# Patient Record
Sex: Female | Born: 1960 | Race: Black or African American | Hispanic: No | Smoking: Former smoker
Health system: Southern US, Community
[De-identification: ages and names within clinical notes are randomized; demographics above are authoritative.]

## PROBLEM LIST (undated history)

## (undated) DIAGNOSIS — J45909 Unspecified asthma, uncomplicated: Secondary | ICD-10-CM

## (undated) DIAGNOSIS — I10 Essential (primary) hypertension: Secondary | ICD-10-CM

## (undated) DIAGNOSIS — N289 Disorder of kidney and ureter, unspecified: Secondary | ICD-10-CM

## (undated) DIAGNOSIS — J449 Chronic obstructive pulmonary disease, unspecified: Secondary | ICD-10-CM

## (undated) DIAGNOSIS — I639 Cerebral infarction, unspecified: Secondary | ICD-10-CM

## (undated) HISTORY — PX: A/V FISTULAGRAM: CATH118298

## (undated) HISTORY — PX: ABDOMINAL SURGERY: SHX537

## (undated) HISTORY — PX: ABDOMINAL HYSTERECTOMY: SHX81

## (undated) HISTORY — PX: HERNIA REPAIR: SHX51

---

## 2008-08-30 ENCOUNTER — Ambulatory Visit: Payer: Self-pay | Admitting: Diagnostic Radiology

## 2008-08-30 ENCOUNTER — Emergency Department (HOSPITAL_BASED_OUTPATIENT_CLINIC_OR_DEPARTMENT_OTHER): Admission: EM | Admit: 2008-08-30 | Discharge: 2008-08-30 | Payer: Self-pay | Admitting: Emergency Medicine

## 2010-12-03 ENCOUNTER — Emergency Department (HOSPITAL_BASED_OUTPATIENT_CLINIC_OR_DEPARTMENT_OTHER)
Admission: EM | Admit: 2010-12-03 | Discharge: 2010-12-03 | Disposition: A | Discharge status: Home / Self Care | Payer: PRIVATE HEALTH INSURANCE | Attending: Emergency Medicine | Admitting: Emergency Medicine

## 2010-12-03 DIAGNOSIS — Z79899 Other long term (current) drug therapy: Secondary | ICD-10-CM | POA: Insufficient documentation

## 2010-12-03 DIAGNOSIS — J449 Chronic obstructive pulmonary disease, unspecified: Secondary | ICD-10-CM | POA: Insufficient documentation

## 2010-12-03 DIAGNOSIS — E785 Hyperlipidemia, unspecified: Secondary | ICD-10-CM | POA: Insufficient documentation

## 2010-12-03 DIAGNOSIS — F329 Major depressive disorder, single episode, unspecified: Secondary | ICD-10-CM | POA: Insufficient documentation

## 2010-12-03 DIAGNOSIS — J4489 Other specified chronic obstructive pulmonary disease: Secondary | ICD-10-CM | POA: Insufficient documentation

## 2010-12-03 DIAGNOSIS — J329 Chronic sinusitis, unspecified: Secondary | ICD-10-CM | POA: Insufficient documentation

## 2010-12-03 DIAGNOSIS — I10 Essential (primary) hypertension: Secondary | ICD-10-CM | POA: Insufficient documentation

## 2010-12-03 DIAGNOSIS — F3289 Other specified depressive episodes: Secondary | ICD-10-CM | POA: Insufficient documentation

## 2014-10-04 ENCOUNTER — Other Ambulatory Visit: Payer: Self-pay | Admitting: Internal Medicine

## 2014-10-07 ENCOUNTER — Other Ambulatory Visit: Payer: Self-pay | Admitting: Internal Medicine

## 2014-11-03 ENCOUNTER — Other Ambulatory Visit: Payer: Self-pay | Admitting: Internal Medicine

## 2017-08-18 ENCOUNTER — Other Ambulatory Visit: Payer: Self-pay

## 2017-08-18 ENCOUNTER — Emergency Department (HOSPITAL_BASED_OUTPATIENT_CLINIC_OR_DEPARTMENT_OTHER): Payer: Medicare Other

## 2017-08-18 ENCOUNTER — Emergency Department (HOSPITAL_BASED_OUTPATIENT_CLINIC_OR_DEPARTMENT_OTHER)
Admission: EM | Admit: 2017-08-18 | Discharge: 2017-08-18 | Disposition: A | Discharge status: Home / Self Care | Payer: Medicare Other | Attending: Emergency Medicine | Admitting: Emergency Medicine

## 2017-08-18 DIAGNOSIS — N189 Chronic kidney disease, unspecified: Secondary | ICD-10-CM | POA: Insufficient documentation

## 2017-08-18 DIAGNOSIS — Z992 Dependence on renal dialysis: Secondary | ICD-10-CM | POA: Insufficient documentation

## 2017-08-18 DIAGNOSIS — D631 Anemia in chronic kidney disease: Secondary | ICD-10-CM | POA: Diagnosis not present

## 2017-08-18 DIAGNOSIS — J111 Influenza due to unidentified influenza virus with other respiratory manifestations: Secondary | ICD-10-CM | POA: Diagnosis not present

## 2017-08-18 DIAGNOSIS — R0602 Shortness of breath: Secondary | ICD-10-CM | POA: Diagnosis present

## 2017-08-18 DIAGNOSIS — N186 End stage renal disease: Secondary | ICD-10-CM

## 2017-08-18 DIAGNOSIS — R6889 Other general symptoms and signs: Secondary | ICD-10-CM

## 2017-08-18 LAB — CBC WITH DIFFERENTIAL/PLATELET
BASOS ABS: 0 10*3/uL (ref 0.0–0.1)
BASOS PCT: 0 %
EOS ABS: 0.1 10*3/uL (ref 0.0–0.7)
Eosinophils Relative: 1 %
HEMATOCRIT: 28.6 % — AB (ref 36.0–46.0)
HEMOGLOBIN: 8.8 g/dL — AB (ref 12.0–15.0)
Lymphocytes Relative: 12 %
Lymphs Abs: 1 10*3/uL (ref 0.7–4.0)
MCH: 30.6 pg (ref 26.0–34.0)
MCHC: 30.8 g/dL (ref 30.0–36.0)
MCV: 99.3 fL (ref 78.0–100.0)
Monocytes Absolute: 0.7 10*3/uL (ref 0.1–1.0)
Monocytes Relative: 9 %
NEUTROS ABS: 6.2 10*3/uL (ref 1.7–7.7)
NEUTROS PCT: 78 %
Platelets: 184 10*3/uL (ref 150–400)
RBC: 2.88 MIL/uL — AB (ref 3.87–5.11)
RDW: 14.9 % (ref 11.5–15.5)
WBC: 8 10*3/uL (ref 4.0–10.5)

## 2017-08-18 LAB — BASIC METABOLIC PANEL
ANION GAP: 13 (ref 5–15)
BUN: 24 mg/dL — ABNORMAL HIGH (ref 6–20)
CALCIUM: 8.4 mg/dL — AB (ref 8.9–10.3)
CO2: 23 mmol/L (ref 22–32)
CREATININE: 6.98 mg/dL — AB (ref 0.44–1.00)
Chloride: 102 mmol/L (ref 101–111)
GFR, EST AFRICAN AMERICAN: 7 mL/min — AB (ref >60)
GFR, EST NON AFRICAN AMERICAN: 6 mL/min — AB (ref >60)
Glucose, Bld: 93 mg/dL (ref 65–99)
Potassium: 5 mmol/L (ref 3.5–5.1)
SODIUM: 138 mmol/L (ref 135–145)

## 2017-08-18 LAB — TROPONIN I

## 2017-08-18 LAB — OCCULT BLOOD X 1 CARD TO LAB, STOOL: Fecal Occult Bld: NEGATIVE

## 2017-08-18 MED ORDER — OSELTAMIVIR PHOSPHATE 30 MG PO CAPS
30.0000 mg | ORAL_CAPSULE | Freq: Once | ORAL | Status: DC
  Filled 2017-08-18: qty 1

## 2017-08-18 MED ORDER — IOPAMIDOL (ISOVUE-370) INJECTION 76%
125.0000 mL | Freq: Once | INTRAVENOUS | Status: AC | PRN
  Administered 2017-08-18: 125 mL via INTRAVENOUS

## 2017-08-18 MED ORDER — OSELTAMIVIR PHOSPHATE 30 MG PO CAPS: 30.0000 mg | ORAL_CAPSULE | Freq: Every day | ORAL | 0 refills | Status: AC

## 2017-08-18 MED ORDER — IPRATROPIUM-ALBUTEROL 0.5-2.5 (3) MG/3ML IN SOLN
3.0000 mL | Freq: Once | RESPIRATORY_TRACT | Status: AC
  Administered 2017-08-18: 3 mL via RESPIRATORY_TRACT
  Filled 2017-08-18: qty 3

## 2017-08-18 NOTE — ED Notes (Signed)
Pt ambulated while on SpO2. Lowest SpO2 90%. Pt became very dyspneic and reports dizziness. HR was at 89.

## 2017-08-18 NOTE — ED Notes (Signed)
Patient transported to X-ray 

## 2017-08-18 NOTE — ED Triage Notes (Addendum)
Pt presents to triage with RR approx 40. RA sats 96%. BBS clear.Encouraged to try to slow breathing. Pt states she is on dialysis (Last dialysis was yesterday). RT in triage to assess. Pt reports increased cough. States she uses home oxygen as needed. 2L nasal cannula applied. Pt states "that really helps". Pt is wearing abdominal binder from recent surgery in the last 2 weeks

## 2017-08-18 NOTE — ED Notes (Signed)
ED Provider at bedside. 

## 2017-08-18 NOTE — Discharge Instructions (Signed)
Take tamiflu as prescribed for the next 5 days.  Continue using your home oxygen as needed.  Follow up with your doctor for further care.  Your chest CT show a nodule in your left lower lung.  Please have a repeat chest CT scan in 6-12 month to monitor for changes of the nodule.

## 2017-08-18 NOTE — ED Provider Notes (Signed)
[facility] EMERGENCY DEPARTMENT Provider Note   CSN: [id number] Arrival date & time: 08/18/17  1821     History   Chief Complaint Chief Complaint  Patient presents with  . Shortness of Breath    HPI Edward JollyLinda Arai is a 57 y.o. female.  HPI   57 year old female presenting to the ER for evaluation of shortness of breath.  Patient is a Monday Wednesday Friday dialysis patient who had her dialysis yesterday.  She noticed after dialysis she developed sinus congestion, increased shortness of breath with exertion, coughing, and runny nose.  Today she is running a low-grade fever.  She used her inhaler at home without adequate relief.  She did not notice any increased wheezing.  She admits to having recent abdominal surgery.  Initially had an abdominal hernia repair on January 8.  A week later she has to have her peritoneal dialysis catheter exchange under general anesthesia.  Patient denies any active chest pain.  She does not make urine.  No dysuria.  No nausea vomiting diarrhea.  She did receive her flu shot this year.  No past medical history on file.  There are no active problems to display for this patient.   The histories are not reviewed yet. Please review them in the "History" navigator section and refresh this SmartLink.  OB History    No data available       Home Medications    Prior to Admission medications   Not on File    Family History No family history on file.  Social History Social History   Tobacco Use  . Smoking status: Not on file  Substance Use Topics  . Alcohol use: Not on file  . Drug use: Not on file     Allergies   Patient has no known allergies.   Review of Systems Review of Systems  All other systems reviewed and are negative.    Physical Exam Updated Vital Signs BP (!) 137/103   Pulse 80   Temp (!) 100.5 F (38.1 C) (Oral)   Resp (!) 36   Ht 5\' 6"  (1.676 m)   Wt 77.1 kg (170 lb)   SpO2 96%   BMI 27.44 kg/m    Physical Exam  Constitutional: She appears well-developed and well-nourished. No distress.  HENT:  Head: Atraumatic.  Mouth/Throat: Oropharynx is clear and moist. No oropharyngeal exudate or posterior oropharyngeal edema.  Eyes: Conjunctivae and EOM are normal. Pupils are equal, round, and reactive to light.  Neck: Neck supple.  Cardiovascular: Normal rate and regular rhythm.  Pulmonary/Chest: Effort normal and breath sounds normal. No accessory muscle usage or stridor. No respiratory distress. She has no decreased breath sounds. She has no wheezes. She has no rales.  Abdominal: Soft. There is no tenderness.  Musculoskeletal:       Right lower leg: She exhibits no edema.       Left lower leg: She exhibits no edema.  Neurological: She is alert.  Skin: No rash noted.  Psychiatric: She has a normal mood and affect.  Nursing note and vitals reviewed.    ED Treatments / Results  Labs (all labs ordered are listed, but only abnormal results are displayed) Labs Reviewed  BASIC METABOLIC PANEL - Abnormal; Notable for the following components:      Result Value   BUN 24 (*)    Creatinine, Ser 6.98 (*)    Calcium 8.4 (*)    GFR calc non Af Amer 6 (*)  GFR calc Af Amer 7 (*)    All other components within normal limits  CBC WITH DIFFERENTIAL/PLATELET - Abnormal; Notable for the following components:   RBC 2.88 (*)    Hemoglobin 8.8 (*)    HCT 28.6 (*)    All other components within normal limits  TROPONIN I  OCCULT BLOOD X 1 CARD TO LAB, STOOL  INFLUENZA PANEL BY PCR (TYPE A & B)    EKG  EKG Interpretation  Date/Time:  Saturday August 18 2017 18:33:32 EST Ventricular Rate:  83 PR Interval:  140 QRS Duration: 84 QT Interval:  432 QTC Calculation: 507 R Axis:   6 Text Interpretation:  Normal sinus rhythm Anterior infarct , age undetermined Abnormal ECG No old tracing to compare Confirmed by Rolan Bucco [phone]) on 08/18/2017 6:47:30 PM       Radiology Dg Chest 2  View  Result Date: 08/18/2017 CLINICAL DATA:  Shortness of breath. EXAM: CHEST  2 VIEW COMPARISON:  Radiograph of August 05, 2017. FINDINGS: Stable cardiomegaly with mild central pulmonary vascular congestion. No pneumothorax or pleural effusion is noted. Both lungs are clear. The visualized skeletal structures are unremarkable. IMPRESSION: Stable cardiomegaly with mild central pulmonary vascular congestion. Electronically Signed   By: Lupita Raider, M.D.   On: 08/18/2017 19:53   Ct Angio Chest Pe W And/or Wo Contrast  Result Date: 08/18/2017 CLINICAL DATA:  Shortness of breath. EXAM: CT ANGIOGRAPHY CHEST WITH CONTRAST TECHNIQUE: Multidetector CT imaging of the chest was performed using the standard protocol during bolus administration of intravenous contrast. Multiplanar CT image reconstructions and MIPs were obtained to evaluate the vascular anatomy. CONTRAST:  80 mL ISOVUE-370 IOPAMIDOL (ISOVUE-370) INJECTION 76% COMPARISON:  Radiographs of same day.  CT scan of July 03, 2014. FINDINGS: Cardiovascular: Satisfactory opacification of the pulmonary arteries to the segmental level. No evidence of pulmonary embolism. Normal heart size. No pericardial effusion. Atherosclerosis of thoracic aorta is noted without aneurysm formation. Mediastinum/Nodes: Thyroid gland and esophagus are unremarkable. Stable adenopathy is noted in aortopulmonary window which most likely is reactive and benign in etiology. Lungs/Pleura: No pneumothorax or pleural effusion is noted. 7 mm nodule is noted in lingular segment of left upper lobe best seen on image number 55 of series 5. Right lung is unremarkable. Upper Abdomen: Bilateral renal atrophy is noted consistent with history of end-stage renal disease. Musculoskeletal: No chest wall abnormality. No acute or significant osseous findings. Review of the MIP images confirms the above findings. IMPRESSION: No definite evidence of pulmonary embolus. Stable mediastinal adenopathy is  noted which most likely is reactive and benign in etiology. 7 mm nodule noted in lingular segment of left upper lobe. Non-contrast chest CT at 6-12 months is recommended. If the nodule is stable at time of repeat CT, then future CT at 18-24 months (from today's scan) is considered optional for low-risk patients, but is recommended for high-risk patients. This recommendation follows the consensus statement: Guidelines for Management of Incidental Pulmonary Nodules Detected on CT Images: From the Fleischner Society 2017; Radiology 2017; 284:228-243. Aortic Atherosclerosis (ICD10-I70.0). Electronically Signed   By: Lupita Raider, M.D.   On: 08/18/2017 20:19    Procedures Procedures (including critical care time)  Medications Ordered in ED Medications  ipratropium-albuterol (DUONEB) 0.5-2.5 (3) MG/3ML nebulizer solution 3 mL (3 mLs Nebulization Given 08/18/17 2000)  iopamidol (ISOVUE-370) 76 % injection 125 mL (125 mLs Intravenous Contrast Given 08/18/17 1942)     Initial Impression / Assessment and Plan / ED Course  I have reviewed the triage vital signs and the nursing notes.  Pertinent labs & imaging results that were available during my care of the patient were reviewed by me and considered in my medical decision making (see chart for details).     BP (!) 161/119   Pulse 83   Temp (!) 100.5 F (38.1 C) (Oral)   Resp (!) 26   Ht 5\' 6"  (1.676 m)   Wt 77.1 kg (170 lb)   SpO2 95%   BMI 27.44 kg/m    Final Clinical Impressions(s) / ED Diagnoses   Final diagnoses:  Flu-like symptoms  Anemia due to chronic kidney disease, on chronic dialysis [facility])    ED Discharge Orders        Ordered    oseltamivir (TAMIFLU) 30 MG capsule  Daily     08/18/17 2302     10:58 PM Patient came in with fever, congestion, shortness of breath and cough.  She is a dialysis patient.  She mentioned having recent abdominal surgery several weeks prior which increased potential risk of PE.  She also has  flulike symptoms.  Influenza test sent.  She recently had her dialysis yesterday.  Fortunately her potassium level is within normal limits.  Her hemoglobin is 8.8, no prior comparison.  Fecal occult test is negative.  A CT of the chest angiogram without evidence of PE.  Incidental nodule noted to left lower lung.  Patient made aware and I encourage repeat CT scan in 6-12 months.  Patient voiced understanding.  She does use home oxygen.  When ambulate, O2 sats never dropped below 90.  Plan to discharge patient with Tamiflu.  Patient will follow with her primary care provider next week.  Return precautions discussed.  Low suspicion for ACS.    Fayrene Helper, PA-C 08/18/17 1610    Rolan Bucco, MD 08/18/17 [phone]

## 2017-08-19 LAB — INFLUENZA PANEL BY PCR (TYPE A & B)
INFLBPCR: NEGATIVE
Influenza A By PCR: NEGATIVE

## 2017-12-02 ENCOUNTER — Encounter (HOSPITAL_BASED_OUTPATIENT_CLINIC_OR_DEPARTMENT_OTHER): Payer: Self-pay | Admitting: Emergency Medicine

## 2017-12-02 ENCOUNTER — Other Ambulatory Visit: Payer: Self-pay

## 2017-12-02 ENCOUNTER — Emergency Department (HOSPITAL_BASED_OUTPATIENT_CLINIC_OR_DEPARTMENT_OTHER)
Admission: EM | Admit: 2017-12-02 | Discharge: 2017-12-02 | Disposition: A | Discharge status: Home / Self Care | Payer: Medicare Other | Attending: Emergency Medicine | Admitting: Emergency Medicine

## 2017-12-02 DIAGNOSIS — H9201 Otalgia, right ear: Secondary | ICD-10-CM | POA: Diagnosis not present

## 2017-12-02 DIAGNOSIS — Z992 Dependence on renal dialysis: Secondary | ICD-10-CM | POA: Diagnosis not present

## 2017-12-02 DIAGNOSIS — J449 Chronic obstructive pulmonary disease, unspecified: Secondary | ICD-10-CM | POA: Diagnosis not present

## 2017-12-02 DIAGNOSIS — N186 End stage renal disease: Secondary | ICD-10-CM | POA: Diagnosis not present

## 2017-12-02 DIAGNOSIS — Z87891 Personal history of nicotine dependence: Secondary | ICD-10-CM | POA: Diagnosis not present

## 2017-12-02 DIAGNOSIS — J029 Acute pharyngitis, unspecified: Secondary | ICD-10-CM | POA: Diagnosis not present

## 2017-12-02 DIAGNOSIS — I120 Hypertensive chronic kidney disease with stage 5 chronic kidney disease or end stage renal disease: Secondary | ICD-10-CM | POA: Diagnosis not present

## 2017-12-02 DIAGNOSIS — Z8673 Personal history of transient ischemic attack (TIA), and cerebral infarction without residual deficits: Secondary | ICD-10-CM | POA: Insufficient documentation

## 2017-12-02 HISTORY — DX: Disorder of kidney and ureter, unspecified: N28.9

## 2017-12-02 HISTORY — DX: Cerebral infarction, unspecified: I63.9

## 2017-12-02 HISTORY — DX: Chronic obstructive pulmonary disease, unspecified: J44.9

## 2017-12-02 HISTORY — DX: Unspecified asthma, uncomplicated: J45.909

## 2017-12-02 HISTORY — DX: Essential (primary) hypertension: I10

## 2017-12-02 LAB — RAPID STREP SCREEN (MED CTR MEBANE ONLY): Streptococcus, Group A Screen (Direct): NEGATIVE

## 2017-12-02 NOTE — ED Triage Notes (Signed)
Patient states that she has had N/V/D up until 3 days ago  - she reports that she is now eating and drinking normally. The patient states that she is here today because she has right ear pain and dizziness  - the patient is a dialysis patient

## 2017-12-02 NOTE — Discharge Instructions (Addendum)
Take over-the-counter medications as needed for pain, follow-up with your doctor next week if not improving

## 2017-12-02 NOTE — ED Provider Notes (Signed)
[facility] EMERGENCY DEPARTMENT Provider Note   CSN: [id number] Arrival date & time: 12/02/17  1051     History   Chief Complaint Chief Complaint  Patient presents with  . Otalgia  . Sore Throat    HPI Rachel Parrish is a 57 y.o. female.  HPI Patient presents to the emergency room for evaluation of earache and sore throat.  Patient states she noticed the symptoms yesterday.  She is having pain in her right ear as well as pain in the right side of her throat.  It hurts when she swallows.  She has had some nasal congestion and drainage.  She denies any fevers or chills.  No shortness of breath.  No chest pain.  She denies any numbness or weakness or trouble with her balance or.  Patient mentioned having some trouble with nausea vomiting and diarrhea but that resolved 3 days ago when she is been asymptomatic since.  Patient is a dialysis patient and has been going regularly.  She denies any issues to see with that. Past Medical History:  Diagnosis Date  . Asthma   . COPD (chronic obstructive pulmonary disease) (HCC)   . Hypertension   . Renal disorder    chronic kidney disease   . Stroke [facility])    TIA    There are no active problems to display for this patient.   Past Surgical History:  Procedure Laterality Date  . A/V FISTULAGRAM    . ABDOMINAL HYSTERECTOMY    . ABDOMINAL SURGERY    . HERNIA REPAIR       OB History   None      Home Medications    Prior to Admission medications   Not on File    Family History History reviewed. No pertinent family history.  Social History Social History   Tobacco Use  . Smoking status: Former Games developer  . Smokeless tobacco: Never Used  Substance Use Topics  . Alcohol use: Never    Frequency: Never  . Drug use: Never     Allergies   Patient has no known allergies.   Review of Systems Review of Systems  All other systems reviewed and are negative.    Physical Exam Updated Vital Signs BP 105/70  (BP Location: Right Arm)   Pulse 82   Temp 98.2 F (36.8 C) (Oral)   Resp 18   Ht 1.676 m ( )   Wt 68 kg (150 lb)   SpO2 100%   BMI 24.21 kg/m   Physical Exam  Constitutional: She appears well-developed and well-nourished. No distress.  HENT:  Head: Normocephalic and atraumatic.  Right Ear: Tympanic membrane, external ear and ear canal normal. No drainage or swelling.  Left Ear: Tympanic membrane, external ear and ear canal normal. No drainage or swelling.  Mouth/Throat: Uvula is midline, oropharynx is clear and moist and mucous membranes are normal. No tonsillar exudate.  Eyes: Conjunctivae are normal. Right eye exhibits no discharge. Left eye exhibits no discharge. No scleral icterus.  Neck: Neck supple. No tracheal deviation present.  Cardiovascular: Normal rate, regular rhythm and intact distal pulses.  Pulmonary/Chest: Effort normal and breath sounds normal. No stridor. No respiratory distress. She has no wheezes. She has no rales.  Abdominal: Soft. Bowel sounds are normal. She exhibits no distension. There is no tenderness. There is no rebound and no guarding.  Musculoskeletal: She exhibits no edema or tenderness.  Lymphadenopathy:    She has no cervical adenopathy.  Neurological: She is alert.  She has normal strength. No cranial nerve deficit (no facial droop, extraocular movements intact, no slurred speech) or sensory deficit. She exhibits normal muscle tone. She displays no seizure activity. Coordination normal.  Skin: Skin is warm and dry. No rash noted.  Psychiatric: She has a normal mood and affect.  Nursing note and vitals reviewed.    ED Treatments / Results  Labs (all labs ordered are listed, but only abnormal results are displayed) Labs Reviewed  RAPID STREP SCREEN ([facility] ONLY)  CULTURE, GROUP A STREP [facility])     Procedures Procedures (including critical care time)  Medications Ordered in ED Medications - No data to display   Initial Impression  / Assessment and Plan / ED Course  I have reviewed the triage vital signs and the nursing notes.  Pertinent labs & imaging results that were available during my care of the patient were reviewed by me and considered in my medical decision making (see chart for details).   Patient presents with complaints of sore throat and earache.  No signs of abscess or abnormality noted on exam.  Strep test is negative.  Most likely viral pharyngitis.  Discussed outpatient management with over-the-counter medications.  Follow-up with her doctor if not improving in a week   Final Clinical Impressions(s) / ED Diagnoses   Final diagnoses:  Pharyngitis, unspecified etiology    ED Discharge Orders    None       Linwood Dibbles, MD 12/02/17 1235

## 2017-12-04 LAB — CULTURE, GROUP A STREP (THRC)

## 2019-04-21 IMAGING — CT CT ANGIO CHEST
2 of 8 series · 18 of 36 positions shown · IV contrast (iopamidol)
Comparison: Radiographs of same day.  CT scan July 03, 2014.

CLINICAL DATA: Shortness of breath.

EXAM:
CT ANGIOGRAPHY CHEST WITH CONTRAST
TECHNIQUE: Multidetector CT imaging of the chest was performed using the
standard protocol during bolus administration of intravenous
contrast. Multiplanar CT image reconstructions and MIPs were
obtained to evaluate the vascular anatomy.
CONTRAST:  80 mL UK7H2C-E00 IOPAMIDOL (UK7H2C-E00) INJECTION 76%

[Series 6: pe thins · axial · 0.71mm/px · z∈[-352,-50]mm · 17 of 338 slices shown]
[im 18/338  lung]
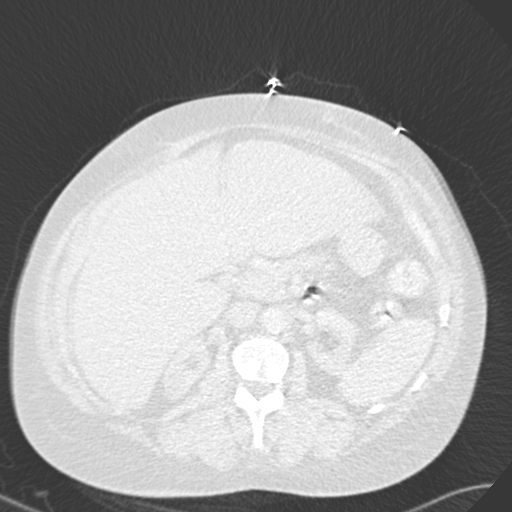
[im 36/338  mediastinal]
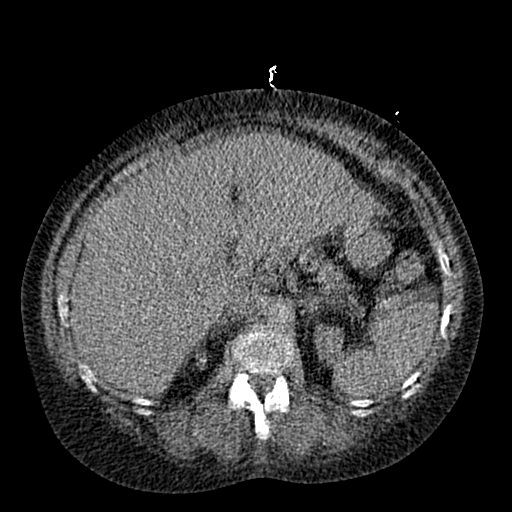
[im 54/338  lung]
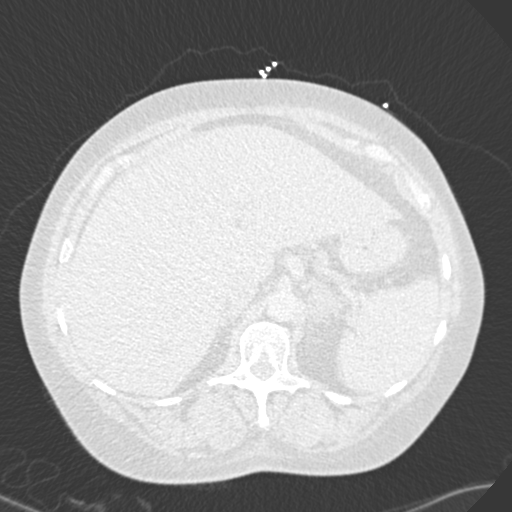
[im 71/338  mediastinal]
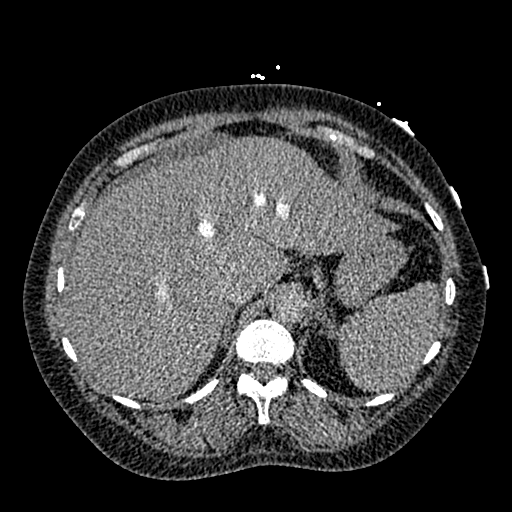
[im 89/338  lung]
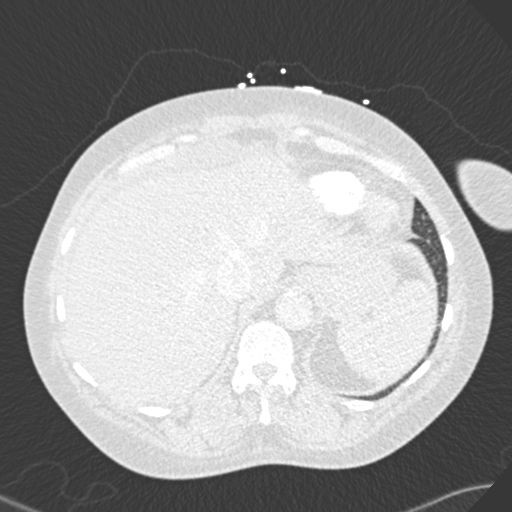
[im 107/338  mediastinal]
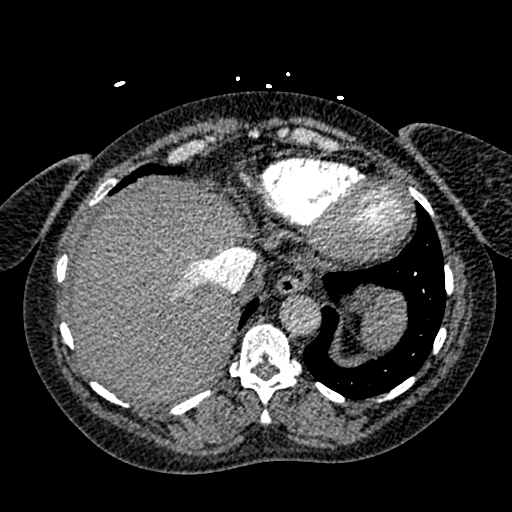
[im 125/338  lung]
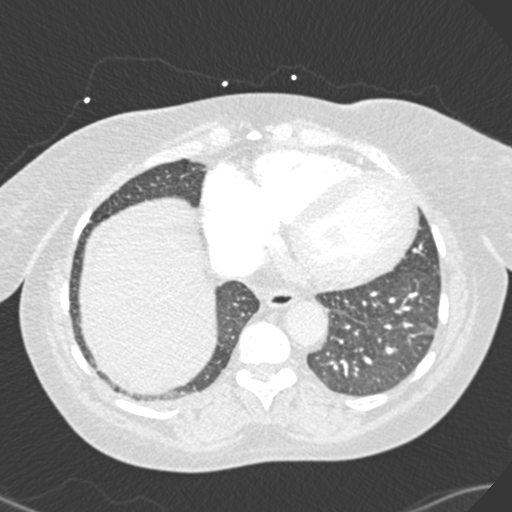
[im 142/338  mediastinal]
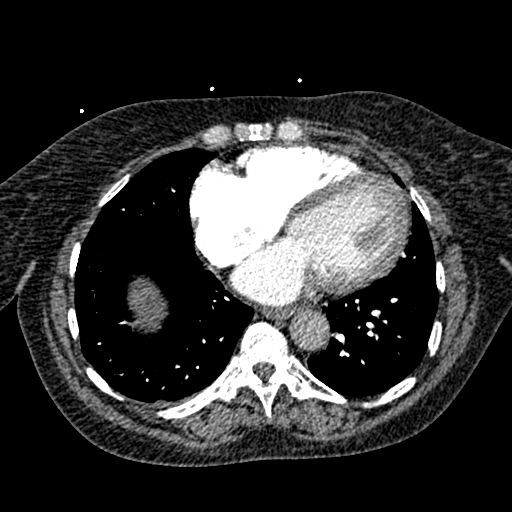
[im 178/338  lung]
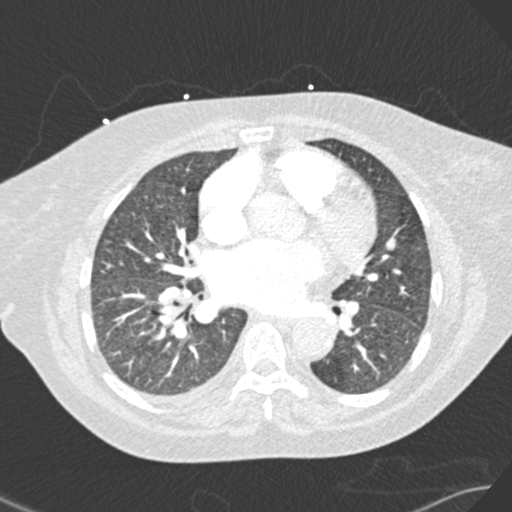
[im 196/338  mediastinal]
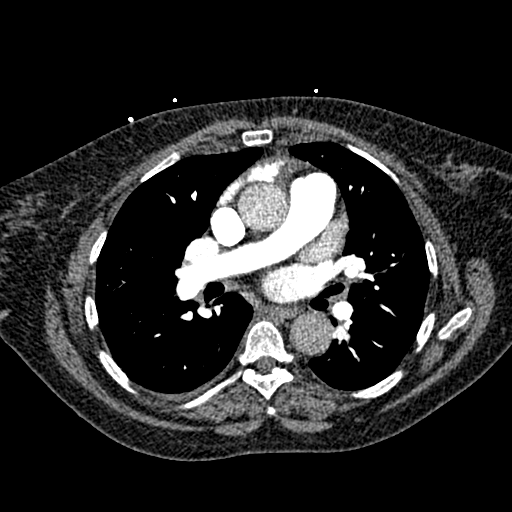
[im 213/338  lung]
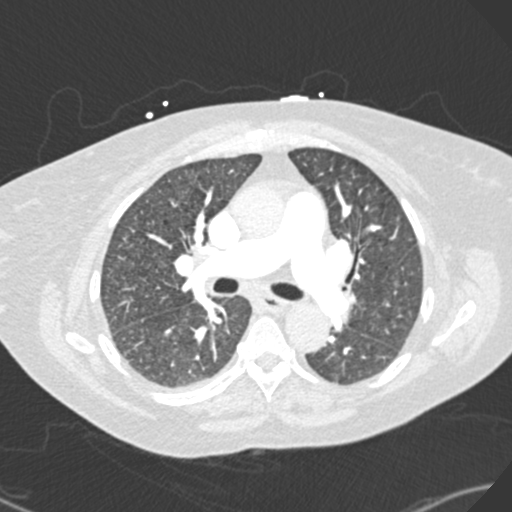
[im 231/338  mediastinal]
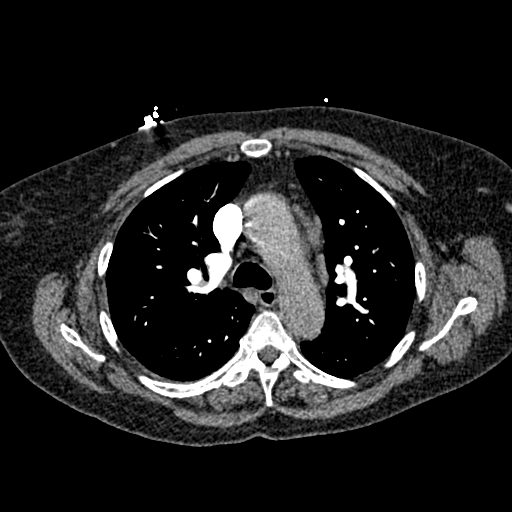
[im 249/338  lung]
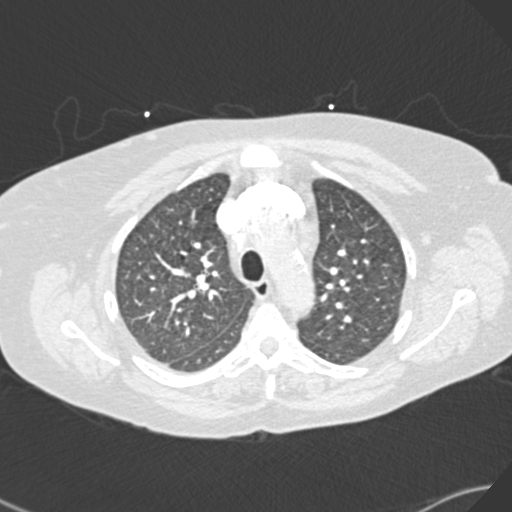
[im 267/338  mediastinal]
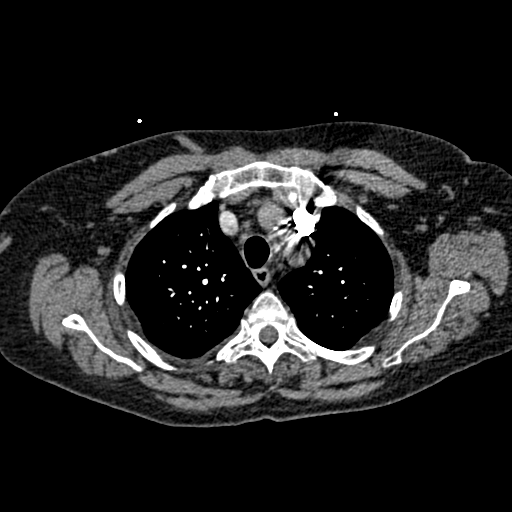
[im 284/338  lung]
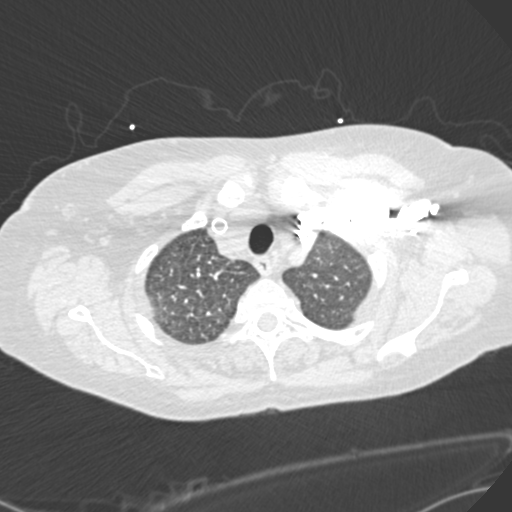
[im 302/338  mediastinal]
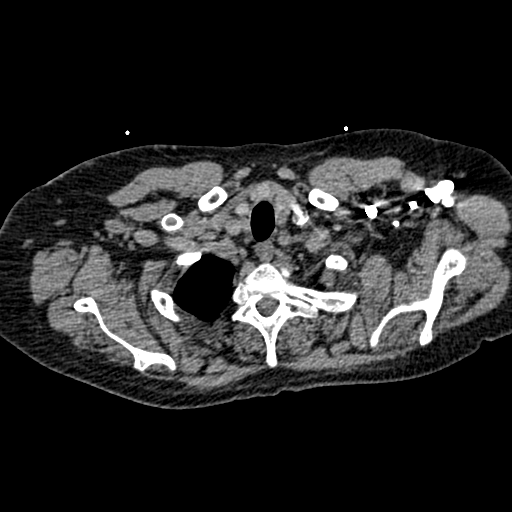
[im 320/338  lung]
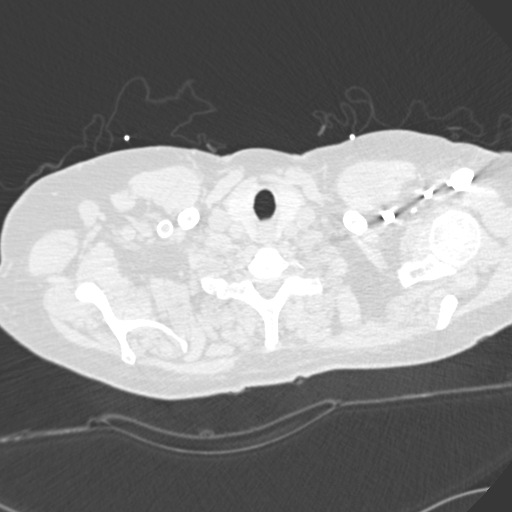

[Series 7: pe coronal mpr · coronal · 0.66mm/px · 1 of 143 slices shown]
[im 72/143  mediastinal]
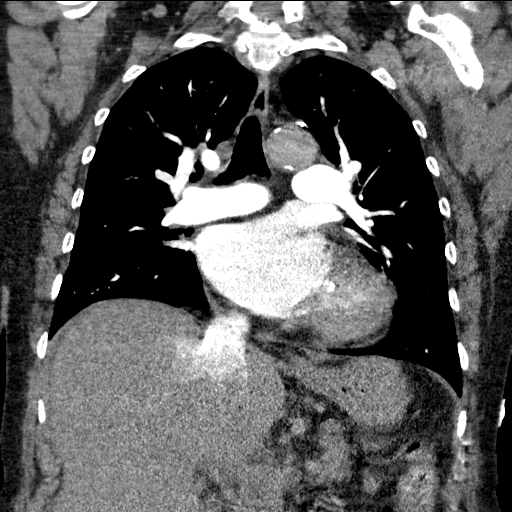

[18 of 36 positions shown; findings below may reference images not displayed]

FINDINGS: Cardiovascular: Satisfactory opacification of the pulmonary arteries
to the segmental level. No evidence of pulmonary embolism. Normal
heart size. No pericardial effusion. Atherosclerosis of thoracic
aorta is noted without aneurysm formation.

Mediastinum/Nodes: Thyroid gland and esophagus are unremarkable.
Stable adenopathy is noted in aortopulmonary window which most
likely is reactive and benign in etiology.

Lungs/Pleura: No pneumothorax or pleural effusion is noted. 7 mm
nodule is noted in lingular segment of left upper lobe best seen on
image number 55 of series 5. Right lung is unremarkable.

Upper Abdomen: Bilateral renal atrophy is noted consistent with
history of end-stage renal disease.

Musculoskeletal: No chest wall abnormality. No acute or significant
osseous findings.

Review of the MIP images confirms the above findings.
IMPRESSION: No definite evidence of pulmonary embolus.

Stable mediastinal adenopathy is noted which most likely is reactive
and benign in etiology.

7 mm nodule noted in lingular segment of left upper lobe.
Non-contrast chest CT at 6-12 months is recommended. If the nodule
is stable at time of repeat CT, then future CT at 18-24 months (from
today's scan) is considered optional for low-risk patients, but is
recommended for high-risk patients. This recommendation follows the
consensus statement: Guidelines for Management of Incidental
Pulmonary Nodules Detected on CT Images: From the [HOSPITAL]

Aortic Atherosclerosis (LK0NF-YHX.X).

## 2019-04-21 IMAGING — DX DG CHEST 2V
2 series · 2 of 2 positions shown · non-contrast
Comparison: Radiograph August 05, 2017.

CLINICAL DATA: Shortness of breath.

EXAM:
CHEST  2 VIEW

[chest pa]
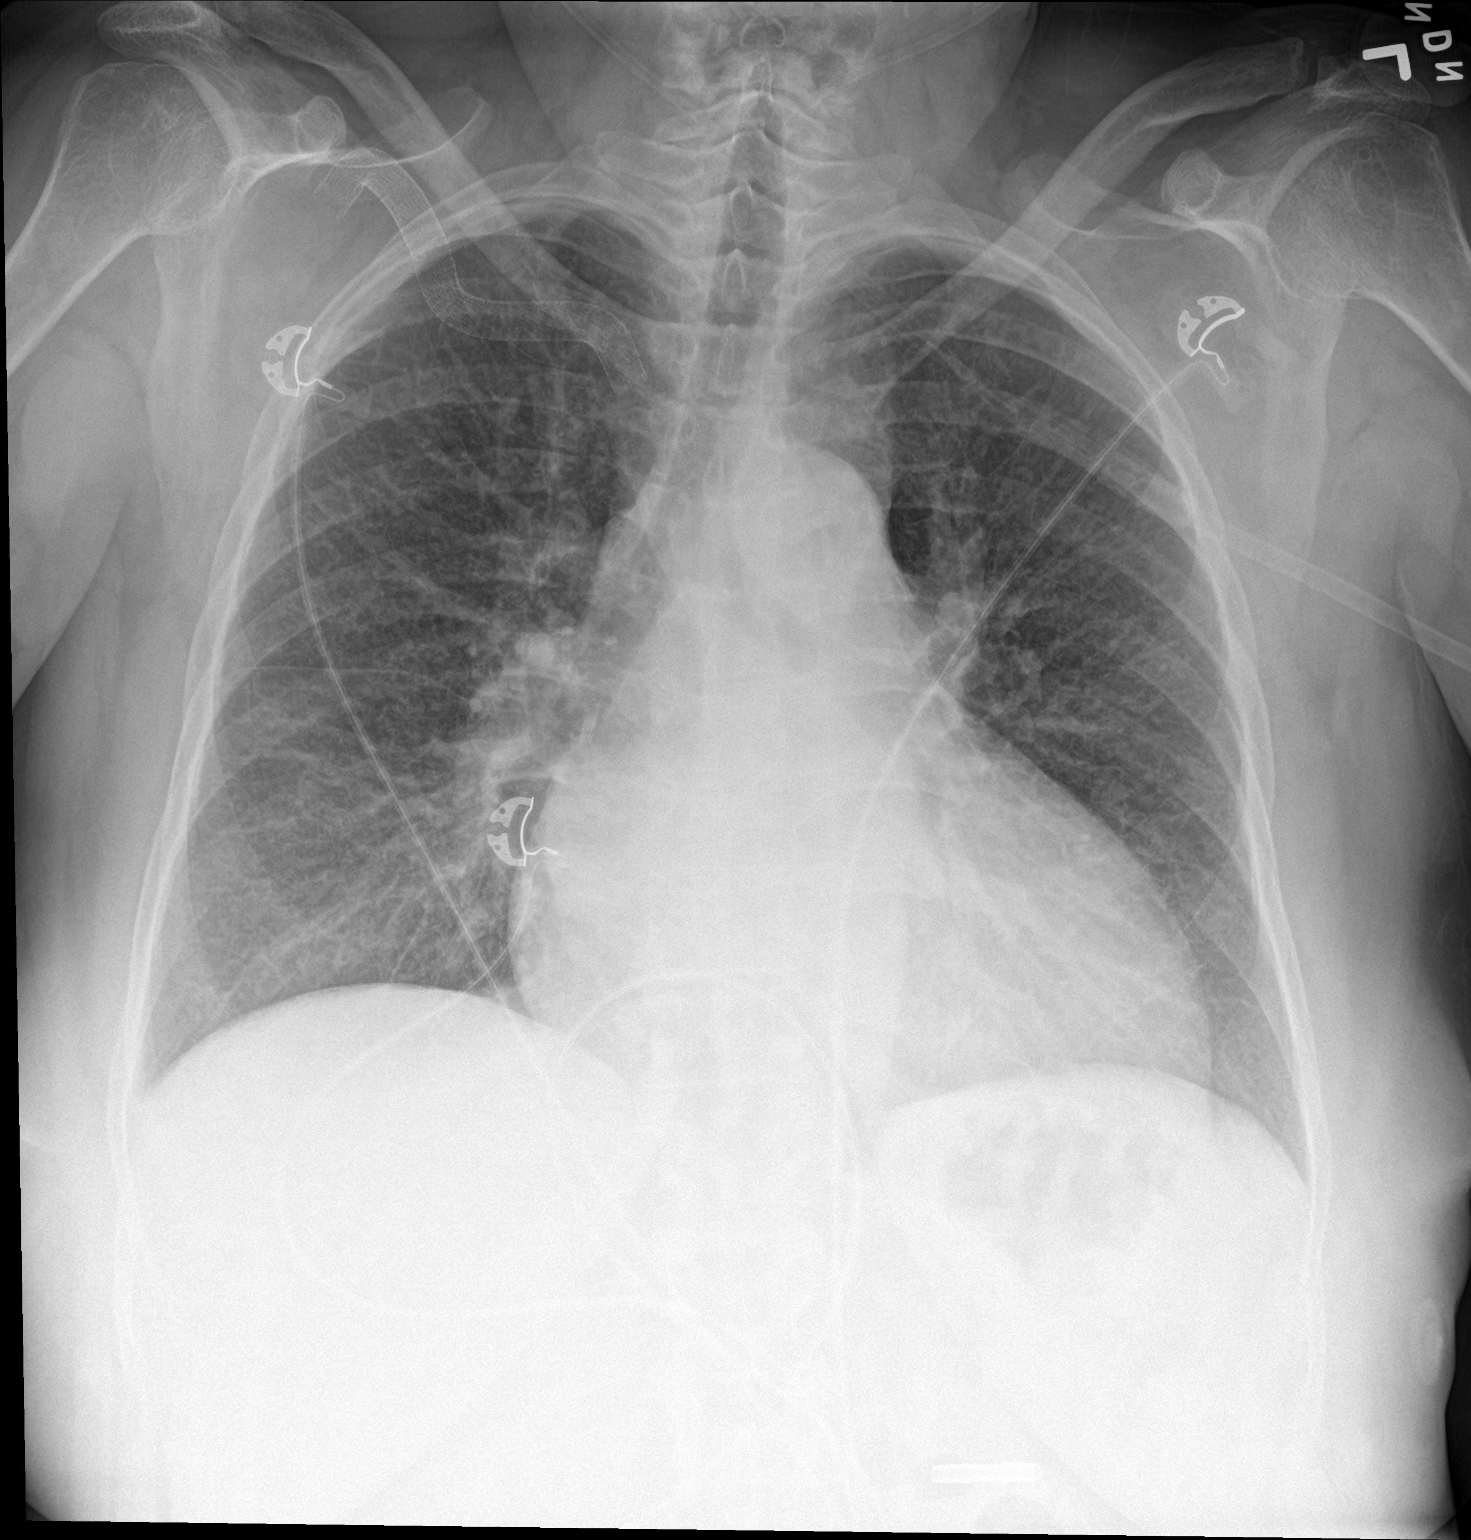

[chest lat]
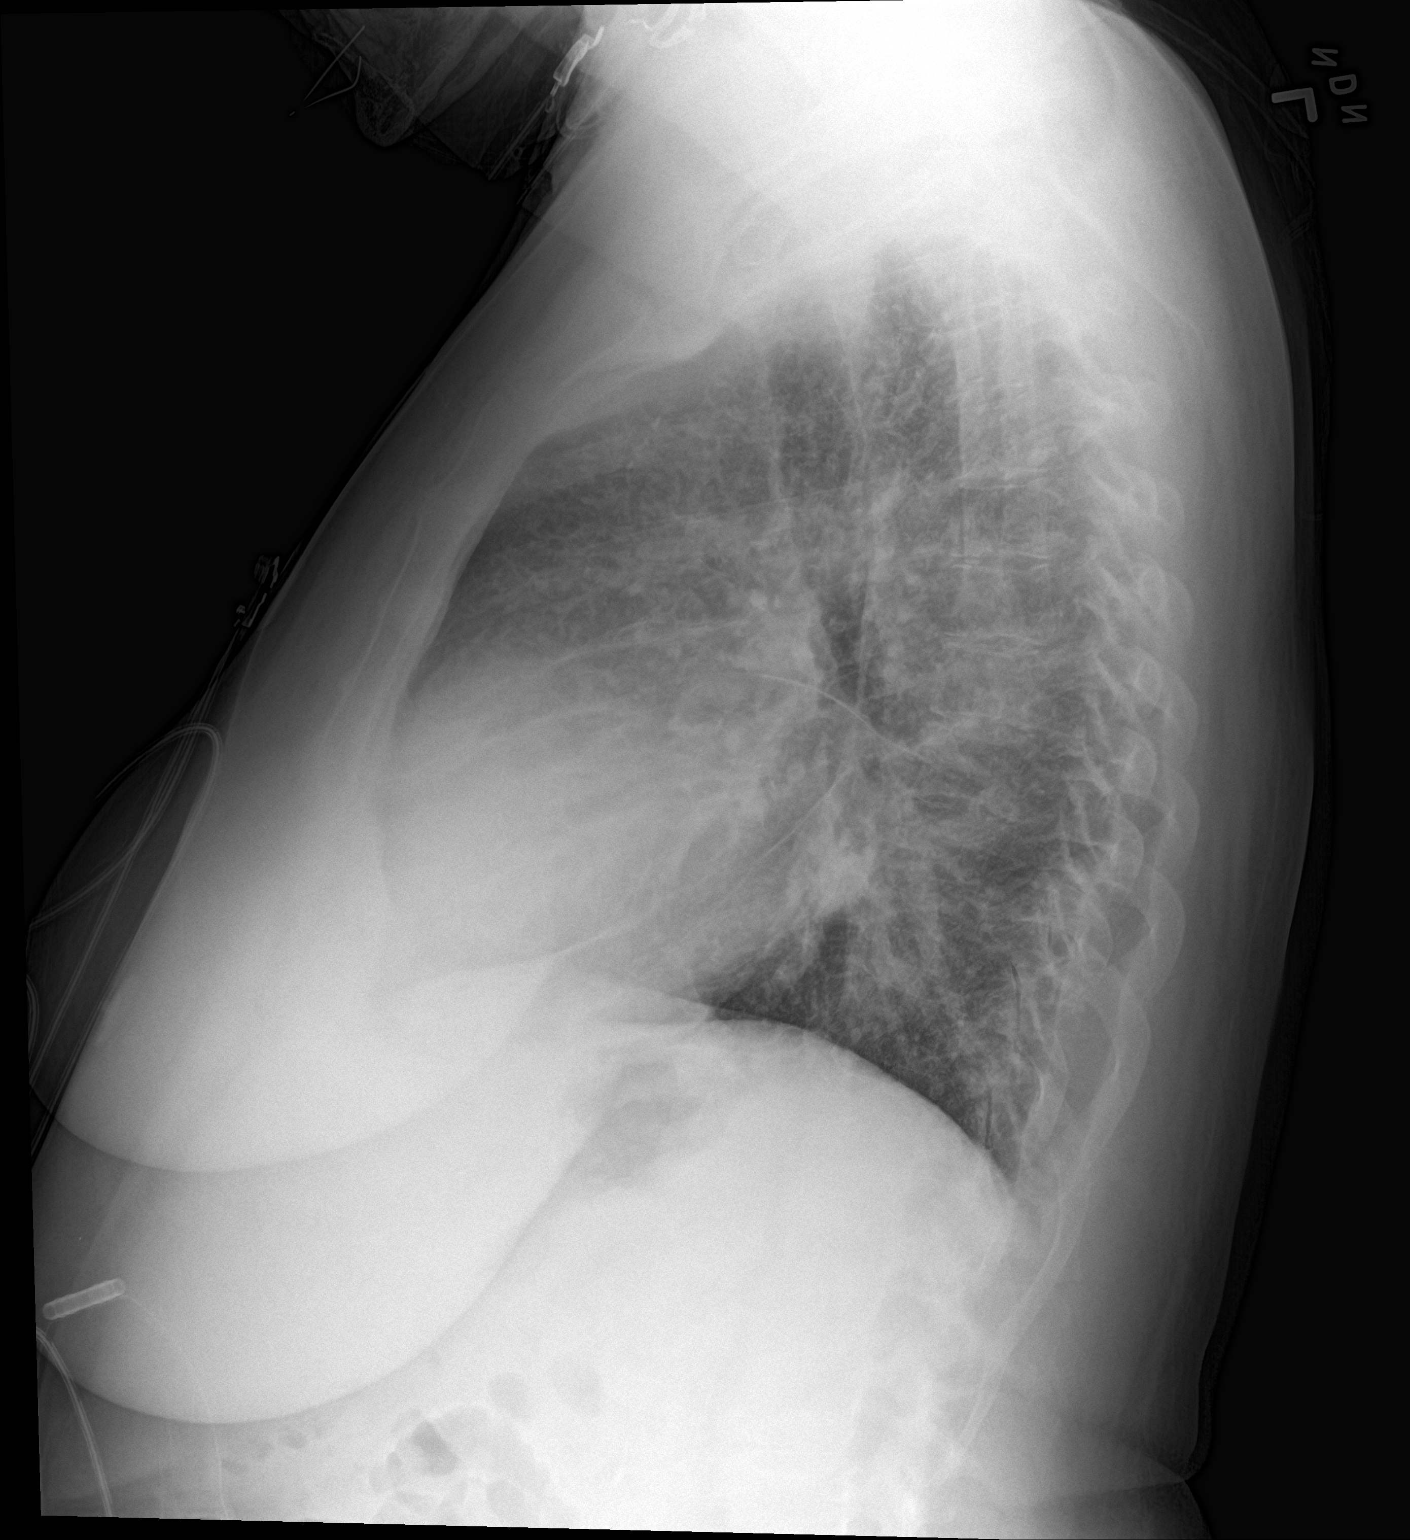

[2 of 2 positions shown; findings below may reference images not displayed]

FINDINGS: Stable cardiomegaly with mild central pulmonary vascular congestion.
No pneumothorax or pleural effusion is noted. Both lungs are clear.
The visualized skeletal structures are unremarkable.
IMPRESSION: Stable cardiomegaly with mild central pulmonary vascular congestion.

## 2020-01-15 DEATH — deceased
